# Patient Record
Sex: Female | Born: 2005 | Race: Black or African American | Hispanic: No | Marital: Single
Health system: Southern US, Community
[De-identification: ages and names within clinical notes are randomized; demographics above are authoritative.]

---
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
## 2024-03-08 ENCOUNTER — Other Ambulatory Visit: Payer: Self-pay

## 2024-03-08 ENCOUNTER — Emergency Department (HOSPITAL_COMMUNITY)
Admission: EM | Admit: 2024-03-08 | Discharge: 2024-03-08 | Disposition: A | Discharge status: Home / Self Care | Attending: Emergency Medicine | Admitting: Emergency Medicine

## 2024-03-08 ENCOUNTER — Encounter (HOSPITAL_COMMUNITY): Payer: Self-pay

## 2024-03-08 DIAGNOSIS — R1013 Epigastric pain: Secondary | ICD-10-CM | POA: Insufficient documentation

## 2024-03-08 LAB — COMPREHENSIVE METABOLIC PANEL WITH GFR
ALT: 20 U/L (ref 0–44)
AST: 21 U/L (ref 15–41)
Albumin: 4.2 g/dL (ref 3.5–5.0)
Alkaline Phosphatase: 68 U/L (ref 38–126)
Anion gap: 11 (ref 5–15)
BUN: 6 mg/dL (ref 6–20)
CO2: 25 mmol/L (ref 22–32)
Calcium: 9.4 mg/dL (ref 8.9–10.3)
Chloride: 105 mmol/L (ref 98–111)
Creatinine, Ser: 0.72 mg/dL (ref 0.44–1.00)
GFR, Estimated: >60 mL/min (ref >60)
Glucose, Bld: 101 mg/dL — ABNORMAL HIGH (ref 70–99)
Potassium: 3.9 mmol/L (ref 3.5–5.1)
Sodium: 141 mmol/L (ref 135–145)
Total Bilirubin: 0.5 mg/dL (ref 0.0–1.2)
Total Protein: 7.7 g/dL (ref 6.5–8.1)

## 2024-03-08 LAB — URINALYSIS, ROUTINE W REFLEX MICROSCOPIC
Bilirubin Urine: NEGATIVE
Glucose, UA: NEGATIVE mg/dL
Hgb urine dipstick: NEGATIVE
Ketones, ur: NEGATIVE mg/dL
Leukocytes,Ua: NEGATIVE
Nitrite: NEGATIVE
Protein, ur: NEGATIVE mg/dL
Specific Gravity, Urine: 1.023 (ref 1.005–1.030)
pH: 5 (ref 5.0–8.0)

## 2024-03-08 LAB — CBC WITH DIFFERENTIAL/PLATELET
Abs Immature Granulocytes: 0.02 K/uL (ref 0.00–0.07)
Basophils Absolute: 0 K/uL (ref 0.0–0.1)
Basophils Relative: 0 %
Eosinophils Absolute: 0.1 K/uL (ref 0.0–0.5)
Eosinophils Relative: 1 %
HCT: 36.9 % (ref 36.0–46.0)
Hemoglobin: 11 g/dL — ABNORMAL LOW (ref 12.0–15.0)
Immature Granulocytes: 0 %
Lymphocytes Relative: 27 %
Lymphs Abs: 2 K/uL (ref 0.7–4.0)
MCH: 22.8 pg — ABNORMAL LOW (ref 26.0–34.0)
MCHC: 29.8 g/dL — ABNORMAL LOW (ref 30.0–36.0)
MCV: 76.4 fL — ABNORMAL LOW (ref 80.0–100.0)
Monocytes Absolute: 0.3 K/uL (ref 0.1–1.0)
Monocytes Relative: 5 %
Neutro Abs: 5 K/uL (ref 1.7–7.7)
Neutrophils Relative %: 67 %
Platelets: 282 K/uL (ref 150–400)
RBC: 4.83 MIL/uL (ref 3.87–5.11)
RDW: 16.6 % — ABNORMAL HIGH (ref 11.5–15.5)
WBC: 7.4 K/uL (ref 4.0–10.5)
nRBC: 0 % (ref 0.0–0.2)

## 2024-03-08 LAB — HCG, SERUM, QUALITATIVE: Preg, Serum: NEGATIVE

## 2024-03-08 LAB — LIPASE, BLOOD: Lipase: 32 U/L (ref 11–51)

## 2024-03-08 MED ORDER — ALUM & MAG HYDROXIDE-SIMETH 200-200-20 MG/5ML PO SUSP
30.0000 mL | Freq: Once | ORAL | Status: AC
  Administered 2024-03-08: 30 mL via ORAL
  Filled 2024-03-08: qty 30

## 2024-03-08 MED ORDER — FAMOTIDINE 20 MG PO TABS: 20.0000 mg | ORAL_TABLET | Freq: Two times a day (BID) | ORAL | 0 refills | Status: AC

## 2024-03-08 MED ORDER — LIDOCAINE VISCOUS HCL 2 % MT SOLN
15.0000 mL | Freq: Once | OROMUCOSAL | Status: AC
  Administered 2024-03-08: 15 mL via ORAL
  Filled 2024-03-08: qty 15

## 2024-03-08 NOTE — ED Triage Notes (Addendum)
 Pt reports with sharp upper abdominal pain, nausea, and a headache since yesterday.

## 2024-03-08 NOTE — ED Provider Notes (Signed)
 [place] EMERGENCY DEPARTMENT AT [facility] Provider Note   CSN: [id number] Arrival date & time: 03/08/24  1654     Patient presents with: Abdominal Pain   Patricia Huber is a 18 y.o. female with no noted past medical history on file presents Emergency Department for evaluation of intermittent epigastric abdominal pain that started yesterday while eating [employer] and fried chicken.  Pain is described as stabbing that worsens with moving.  Pain lasts a couple seconds and resolves.  Last BM today and normal.  Is passing gas.  LMP is irregular but was at the end of September and per her normal.  No history of abdominal surgeries.  Denies urinary symptoms, vaginal bleeding.  Today, woke up nauseous and sought ED evaluation regarding concern of continued pain.  No OTC medications prior to arrival.     Abdominal Pain      Prior to Admission medications   Medication Sig Start Date End Date Taking? Authorizing Provider  famotidine (PEPCID) 20 MG tablet Take 1 tablet (20 mg total) by mouth 2 (two) times daily. 03/08/24  Yes Minnie Tinnie BRAVO, PA    Allergies: Patient has no known allergies.    Review of Systems  Gastrointestinal:  Positive for abdominal pain.    Updated Vital Signs BP (!) 136/103 (BP Location: Left Arm)   Pulse 93   Temp 99.2 F (37.3 C) (Oral)   Resp 16   Ht 5' 11 (1.803 m)   SpO2 100%   Physical Exam Vitals and nursing note reviewed.  Constitutional:      General: She is not in acute distress.    Appearance: Normal appearance.  HENT:     Head: Normocephalic and atraumatic.  Eyes:     Conjunctiva/sclera: Conjunctivae normal.  Cardiovascular:     Rate and Rhythm: Normal rate.  Pulmonary:     Effort: Pulmonary effort is normal. No respiratory distress.  Abdominal:     Tenderness: There is abdominal tenderness in the epigastric area. There is no right CVA tenderness, left CVA tenderness, guarding or rebound.     Comments: No peritoneal signs.   Nonsurgical abdomen.  Skin:    Coloration: Skin is not jaundiced or pale.  Neurological:     Mental Status: She is alert. Mental status is at baseline.     (all labs ordered are listed, but only abnormal results are displayed) Labs Reviewed  COMPREHENSIVE METABOLIC PANEL WITH GFR - Abnormal; Notable for the following components:      Result Value   Glucose, Bld 101 (*)    All other components within normal limits  CBC WITH DIFFERENTIAL/PLATELET - Abnormal; Notable for the following components:   Hemoglobin 11.0 (*)    MCV 76.4 (*)    MCH 22.8 (*)    MCHC 29.8 (*)    RDW 16.6 (*)    All other components within normal limits  LIPASE, BLOOD  URINALYSIS, ROUTINE W REFLEX MICROSCOPIC  HCG, SERUM, QUALITATIVE    EKG: None  Radiology: No results found.   Medications Ordered in the ED  alum & mag hydroxide-simeth (MAALOX/MYLANTA) 200-200-20 MG/5ML suspension 30 mL (30 mLs Oral Given 03/08/24 1918)    And  lidocaine (XYLOCAINE) 2 % viscous mouth solution 15 mL (15 mLs Oral Given 03/08/24 1918)                                    Medical Decision  Making Amount and/or Complexity of Data Reviewed Labs: ordered.  Risk OTC drugs. Prescription drug management.   Patient presents to the ED for concern of epigastric pain, this involves an extensive number of treatment options, and is a complaint that carries with it a high risk of complications and morbidity.  The differential diagnosis includes gastritis, GERD, PUD, perforated ulcer, gallbladder pathology, ACS, obstruction, perforation, PE   Co morbidities that complicate the patient evaluation  None documented   Additional history obtained:  Additional history obtained from  Nursing   External records from outside source obtained and reviewed including triage note   Lab Tests:  I Ordered, and personally interpreted labs.  The pertinent results include:   hCG negative UA without infection Hgb 11    Medicines  ordered and prescription drug management:  I ordered medication including maalox  for suspected reflux  Reevaluation of the patient after these medicines showed that the patient improved I have reviewed the patients home medicines and have made adjustments as needed     Problem List / ED Course:  Epigastric pain Nonsurgical abdomen no peritoneal signs Other than diastolic hypertension, vital signs without tachycardia or fever Passes p.o. challenge.  No vomiting Will provide Maalox for epigastric pain which is likely secondary to gastritis, GERD.  She also has poor diet of fatty and spicy foods which is probably not helping.  Also endorsed that she had some alcohol this weekend which could be contributing.  Pain significantly improved following Maalox supporting diagnosis of GERD, reflux, gastritis Low suspicion for ACS due to patient having no documented risk factors and age of 18 years old.  No complaints of chest pain or shortness of breath.  Pain is reproducible with palpation and movement.  No exertional component. PERC negative.  Low suspicion for PE Lungs sounds CTAB.  Making oxygen saturation without supplementation. Lab work without acute abnormality.   No transaminitis nor elevated bili.  Low suspicion for gallbladder pathology especially as she endorses that pain does not worsen after eating. Low suspicion for obstruction as patient is passing gas and regular bowel movements Has no PCP so does not have any documented past medical history.  Providing PCP recommendation and stressed importance of establishing care with a PCP for routine medical complaints, annual visits, follow-up Discussed symptomatic treatment at home to include compliance with Pepcid, Tums for acute epigastric reflux pain, avoiding triggers, spicy foods, fatty foods   Reevaluation:  After the interventions noted above, I reevaluated the patient and found that they have :improved    Dispostion:  After  consideration of the diagnostic results and the patients response to treatment, I feel that the patent would benefit from outpatient management.   Discussed ED workup, disposition, return to ED precautions with patient who expresses understanding agrees with plan.  All questions answered to their satisfaction.  They are agreeable to plan.  Discharge instructions provided on paperwor  Final diagnoses:  Epigastric pain    ED Discharge Orders          Ordered    famotidine (PEPCID) 20 MG tablet  2 times daily        03/08/24 1923             Minnie Tinnie BRAVO, PA 03/08/24 1925    Cottie Donnice PARAS, MD 03/08/24 [phone]

## 2024-03-08 NOTE — Discharge Instructions (Addendum)
 Thank you for letting us  evaluate you today.  Your lab work was unremarkable for acute abnormality.  You are not pregnant.  Your urine is without infection or blood.  Your electrolytes are within normal limits.  You do not have an elevated white blood cell count to indicate severe infection or inflammation.  Your blood pressure is mildly elevated but you can follow-up with primary care provider for this.  Please establish care with a primary care provider.  I provided you recommendation if you want to use mine.  I have also provided you with Pepcid prescription for suspected reflux.  This should help with symptoms.  I would avoid spicy foods, fatty foods, alcohol as this may worsen reflux  Return to Emergency Department if you experience intractable vomiting, severe worsening of abdominal pain, chest pain, shortness of breath, inability to pass gas
